# Patient Record
Sex: Male | Born: 1965 | Hispanic: Yes | Smoking: Never smoker
Health system: Southern US, Community
[De-identification: ages and names within clinical notes are randomized; demographics above are authoritative.]

---

## 2017-07-08 ENCOUNTER — Ambulatory Visit: Payer: Worker's Compensation

## 2017-07-08 ENCOUNTER — Encounter: Payer: Self-pay | Admitting: Family Medicine

## 2017-07-08 ENCOUNTER — Ambulatory Visit (INDEPENDENT_AMBULATORY_CARE_PROVIDER_SITE_OTHER): Payer: Worker's Compensation | Admitting: Family Medicine

## 2017-07-08 VITALS — BP 130/80 | HR 83 | Temp 98.1°F | Resp 16 | Ht 64.0 in | Wt 206.4 lb

## 2017-07-08 DIAGNOSIS — M549 Dorsalgia, unspecified: Secondary | ICD-10-CM

## 2017-07-08 DIAGNOSIS — M545 Low back pain, unspecified: Secondary | ICD-10-CM

## 2017-07-08 DIAGNOSIS — M542 Cervicalgia: Secondary | ICD-10-CM

## 2017-07-08 DIAGNOSIS — W19XXXA Unspecified fall, initial encounter: Secondary | ICD-10-CM

## 2017-07-08 MED ORDER — MELOXICAM 7.5 MG PO TABS: 7.5000 mg | ORAL_TABLET | Freq: Every day | ORAL | 0 refills | Status: AC | PRN

## 2017-07-08 MED ORDER — CYCLOBENZAPRINE HCL 5 MG PO TABS: ORAL_TABLET | ORAL | 0 refills | Status: DC

## 2017-07-08 NOTE — Progress Notes (Signed)
Subjective:    Patient ID: Steven Moody, male    DOB: 1965/06/10, 52 y.o.   MRN: [id number]  HPI Steven Moody is a 52 y.o. male Presents today for: Chief Complaint  Patient presents with  . 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 neck pain   Presents with neck and back pain after injury at work 5 days ago (DOI 07/03/17). Was working in [place].  Was pulling a big tree to plant. Tripped and fell on back.  Initially did not think it was going to be an issue, but felt pain about 20 minutes later in neck. Did continue to work including using shovel. Felt some numbness in hands later that night. Still feels something different in hands - feels overall the same. Does not feel any weakness. Back pain is all over, noted night after injury. No bowel or bladder incontinence, no saddle anesthesia, no lower extremity weakness. Was having trouble working since 3 days ago at noon due to pain. No prior back surgery/injury.  No peptic ulcer disease or kidney issues known.    Tx: unknown name, but some pain medication over the counter.    Spanish speaking - video interpreter # [id number]   There are no active problems to display for this patient.  No past medical history on file.  No Known Allergies Prior to Admission medications   Not on File   Social History   Socioeconomic History  . Marital status: Unknown    Spouse name: Not on file  . Number of children: Not on file  . Years of education: Not on file  . Highest education level: Not on file  Social Needs  . Financial resource strain: Not on file  . Food insecurity - worry: Not on file  . Food insecurity - inability: Not on file  . Transportation needs - medical: Not on file  . Transportation needs - non-medical: Not on file  Occupational History  . Not on file  Tobacco Use  . Smoking status: Never Smoker  . Smokeless tobacco: Never Used  Substance and Sexual Activity    . Alcohol use: Yes    Alcohol/week: 4.2 oz    Types: 7 Standard drinks or equivalent per week  . Drug use: No  . Sexual activity: Not on file  Other Topics Concern  . Not on file  Social History Narrative  . Not on file    Review of Systems  Musculoskeletal: Positive for arthralgias, back pain, myalgias and neck pain. Negative for joint swelling and neck stiffness.  Neurological: Positive for numbness.      Objective:   Physical Exam  Constitutional: He appears well-developed and well-nourished.  HENT:  Head: Normocephalic and atraumatic.  Neck: Normal range of motion.  Pulmonary/Chest: Effort normal.  Abdominal: Soft. There is no tenderness.  Musculoskeletal: He exhibits tenderness.       Cervical back: He exhibits tenderness, bony tenderness (Diffusely tender mid cervical spine.), pain and spasm.       Thoracic back: He exhibits tenderness and bony tenderness (Diffusely tender thoracic to lumbar spine including paraspinals no ecchymosis, skin intact).       Lumbar back: He exhibits tenderness and spasm. He exhibits normal range of motion and no bony tenderness.  Neurological: He is alert. He has normal strength. No sensory deficit.  Reflex Scores:      Tricep reflexes are 2+ on  the right side and 2+ on the left side.      Bicep reflexes are 2+ on the right side and 2+ on the left side.      Brachioradialis reflexes are 2+ on the right side and 2+ on the left side.      Patellar reflexes are 2+ on the right side and 2+ on the left side.      Achilles reflexes are 2+ on the right side and 2+ on the left side. Grip strength equal, arm strength equal., No focal weakness lower extremities.  Skin: Skin is warm and dry.  Psychiatric: He has a normal mood and affect. His behavior is normal.  Vitals reviewed.  Vitals:   07/08/17 1216  BP: 130/80  Pulse: 83  Resp: 16  Temp: 98.1 F (36.7 C)  TempSrc: Oral  SpO2: 96%  Weight: 206 lb 6.4 oz (93.6 kg)  Height: 5\' 4"  (1.626 m)     Dg Cervical Spine Complete  Result Date: 07/08/2017 CLINICAL DATA:  Patient fell 5 days ago and has neck pain and numbness in the hands. EXAM: CERVICAL SPINE - COMPLETE 4+ VIEW COMPARISON:  None in PACs. FINDINGS: The cervical vertebral bodies are preserved in height. The disc space heights are reasonably well-maintained. There are anterior endplate osteophytes nearly bridging the C3-4 and C6-7 levels. The prevertebral soft tissue spaces are normal. There is no perched facet. The spinous processes are intact though the C7 spinous process is not well demonstrated. The odontoid is intact. The oblique views reveal no high-grade bony encroachment upon the neural foramina. IMPRESSION: Very mild degenerative disc changes centered at C3-4 and C6-7. No compression fracture nor other acute bony abnormality of the cervical spine. Electronically Signed   By: David  [place] M.D.   On: 07/08/2017 13:13   Dg Thoracic Spine 2 View  Result Date: 07/08/2017 CLINICAL DATA:  The patient reports a fall 5 days ago and has neck pain and numbness in the hands. EXAM: THORACIC SPINE 2 VIEWS COMPARISON:  None in PACs FINDINGS: The thoracic vertebral bodies are preserved in height. The disc space heights are reasonably well-maintained. The pedicles are intact. There is mild degenerative endplate spurring in the mid and lower lumbar spine. There are no abnormal paravertebral soft tissue densities. IMPRESSION: There is mild multilevel degenerative disc disease in the mid and lower thoracic spine. There is no compression fracture. Electronically Signed   By: David  [place] M.D.   On: 07/08/2017 13:17   Dg Lumbar Spine Complete  Result Date: 07/08/2017 CLINICAL DATA:  Patient fell 5 days ago and has neck pain and numbness in the hands. EXAM: LUMBAR SPINE - COMPLETE 4+ VIEW COMPARISON:  None in PACs FINDINGS: The lumbar vertebral bodies are preserved in height. There is disc space narrowing at L3-4, L4-5, and L5-S1. There is no  spondylolisthesis. There is degenerative facet joint change at L5-S1. There are anterior near bridging spurs at L1-2, L2-3, and L3-4. There are lateral bridging osteophytes noted bilaterally at L2-3 and at L4-5 on the left. The pedicles and transverse processes are intact. IMPRESSION: There is multilevel degenerative disc disease. There is no compression fracture or spondylolisthesis. There is mild facet joint hypertrophy at L5-S1. Electronically Signed   By: David  [place] M.D.   On: 07/08/2017 13:16         Assessment & Plan:    Steven Moody is a 52 y.o. male Fall, initial encounter  Neck pain - Plan: DG Cervical Spine Complete, cyclobenzaprine (FLEXERIL)  5 MG tablet, meloxicam (MOBIC) 7.5 MG tablet  Upper back pain - Plan: DG Thoracic Spine 2 View, cyclobenzaprine (FLEXERIL) 5 MG tablet, meloxicam (MOBIC) 7.5 MG tablet  Acute bilateral low back pain without sciatica - Plan: DG Lumbar Spine Complete, cyclobenzaprine (FLEXERIL) 5 MG tablet, meloxicam (MOBIC) 7.5 MG tablet  Fall at work with date of injury February 27. Denied initial pain or debility, but notice soreness in neck 20 minutes later. Some initial dysesthesias of hands, but no weakness. Reassuring exam at present without signs of weakness and intact reflexes. Degenerative changes seen on x-ray without fracture. Suspected cervical strain with secondary spasm.   -Symptomatic care with meloxicam daily, Flexeril up to every 8 hours - with plan to transition to bedtime due to sedation risk. Side effects, fall precautions discussed   -Out of work for next 3 days, then recheck. RTC/ER precautions if worsening sooner   -Note provided for employer   -Spanish speaking interpreter used as above with understanding expressed  Meds ordered this encounter  Medications  . cyclobenzaprine (FLEXERIL) 5 MG tablet    Sig: 1 pill by mouth up to every 8 hours as needed. Start with one pill by mouth each bedtime as needed due to sedation.    Label in spanish    Dispense:  15 tablet    Refill:  0  . meloxicam (MOBIC) 7.5 MG tablet    Sig: Take 1 tablet (7.5 mg total) by mouth daily as needed for pain.    Dispense:  30 tablet    Refill:  0   Patient Instructions   meloxicam cada dia si necesario.cyclobenzaprine cada 8 horas si necesario.  No otro medicina sin receta. No trabaje a esta tiempo, regrese en 3 dias. Mas temprano o cuarto de emergencia si empeorse   Dolor de espalda en adultos Back Pain, Adult Muchos adultos sufren dolor de espalda de vez en cuando. Las causas comunes del dolor de espalda son las siguientes:  Neomia Dear distensin del msculo o el ligamento.  Desgaste (degeneracin) de los discos vertebrales.  Artritis.  Un golpe en la espalda.  El dolor de espalda puede ser breve (agudo) o prolongado (crnico). Es posible que se realicen un examen fsico, anlisis de laboratorio u otros estudios de diagnstico por imgenes para Veterinary surgeon causa del Engineer, mining. Siga estas indicaciones en su casa: Control del dolor y de la rigidez  [employer] medicamentos de venta libre y los recetados solamente como se lo haya indicado el mdico.  Si se lo indican, aplique calor en la zona afectada con la frecuencia que le haya indicado el mdico. Use la fuente de calor que el mdico le recomiende, como una compresa de calor hmedo o una almohadilla trmica. ? Colquese una [employer] piel y la fuente de Airline pilot. ? Aplique el calor durante 20 a . ? Retire la fuente de calor si la piel se le pone de color rojo brillante. Esto es muy importante si no puede sentir el dolor, el calor ni el fro. Corre un mayor riesgo de sufrir quemaduras.  Si se lo indican, aplique hielo sobre la zona lesionada: ? Ponga el hielo en una bolsa plstica. ? Coloque una [employer] piel y la bolsa de hielo. ? Deje el hielo durante , de 2 a 3veces por da, durante los primeros 2 o 3das. Actividad  No permanezca en la cama. Si  hace reposo ms de 1 a 2 das, puede demorar su recuperacin.  Realice caminatas cortas en  superficies planas tan pronto como le sea posible. Trate de caminar un poco ms de Pharmacist, communitytiempo cada da.  No se siente, conduzca o permanezca de pie en un mismo lugar durante ms de 30 minutos seguidos. Pararse o sentarse durante largos perodos de Contractortiempo puede sobrecargar la espalda.  Use tcnicas apropiadas para levantar objetos. Cuando se inclina y Solicitorlevanta un [place]objeto, utilice posiciones que no sobrecarguen tanto la espalda: ? Flexione las rodillas. ? Mantenga la carga cerca del cuerpo. ? No se tuerza.  Haga actividad fsica habitualmente como se lo haya indicado el mdico. Hacer ejercicio ayudar a que su espalda se cure ms rpido. Adems, esto ayuda a prevenir lesiones de espalda al Kimberly-Clarkmantener los msculos fuertes y flexibles.  El mdico puede recomendarle que consulte a un fisioterapeuta. Esta persona puede ayudarlo a elaborar un programa de ejercicios seguro. Haga ejercicios como se lo haya indicado el fisioterapeuta. Estilo de vida  Mantenga un peso saludable. El sobrepeso sobrecarga la espalda y hace que resulte difcil tener una buena [place]postura.  Evite actividades o situaciones que lo hagan sentirse ansioso o estresado. Aprenda maneras de manejar la ansiedad y el estrs. Una forma de controlar el estrs es a travs del ejercicio. El estrs y la ansiedad aumentan la tensin muscular y pueden empeorar el dolor de espalda. Instrucciones generales  Duerma sobre un colchn firme en una posicin cmoda. Intente acostarse de costado, con las rodillas ligeramente flexionadas. Si se recuesta Fisher Scientificsobre la espalda, coloque una almohada debajo de las rodillas.  Siga el plan de tratamiento como se lo haya indicado el mdico. Esto puede incluir lo siguiente: ? Terapia cognitiva o conductual. ? Acupuntura o terapia de masajes. ? Yoga o meditacin. Comunquese con un mdico si:  Siente un dolor que no se alivia con  reposo o medicamentos.  Siente mucho dolor que se extiende a las piernas o las nalgas.  El dolor no mejora en 2 semanas.  Siente dolor por la noche.  Pierde peso.  Tiene fiebre o siente escalofros. Solicite ayuda de inmediato si:  Tiene nuevos problemas para controlar la vejiga o los intestinos.  Siente debilidad o adormecimiento inusuales en los brazos o en las piernas.  Siente nuseas o vmitos.  Siente dolor abdominal.  Siente que va a desmayarse. Resumen  Muchos adultos sufren dolor de espalda de vez en cuando. Es posible que se realicen un examen fsico, anlisis de laboratorio u otros estudios de diagnstico por imgenes para Veterinary surgeonencontrar la causa del Engineer, miningdolor.  Use tcnicas apropiadas para levantar objetos. Cuando se inclina y levanta un [place]objeto, utilice posiciones que no sobrecarguen tanto la espalda.  Tome los medicamentos de venta libre o recetados y aplique calor o hielo solamente como se lo haya indicado el mdico. Esta informacin no tiene Theme park managercomo fin reemplazar el consejo del mdico. Asegrese de hacerle al mdico cualquier pregunta que tenga. Document Released: 04/23/2005 Document Revised: 08/13/2016 Document Reviewed: 08/13/2016 Elsevier Interactive Patient Education  2018 [employer]  Esguince cervical Cervical Sprain Un esguince cervical es un estiramiento o desgarro en uno o ms de los resistentes tejidos tipo cuerdas que conectan los huesos (ligamentos) en la nuca. Un esguince cervical puede ser desde muy leve a muy grave. En los casos graves, el esguince cervical puede hacer que los huesos de la columna (vrtebras) en el cuello se vuelvan inestables. Esto puede causar un dao en la mdula espinal y puede dar Environmental consultantlugar a graves problemas del Sail Harborsistema nervioso. La cantidad de tiempo que demora la mejora de un esguince  cervical depende de la causa y de la extensin de la lesin. La mayora de las veces se curan en 4 a 6 semanas. Cules son las causas? Los esguinces  cervicales pueden producirse por una lesin (traumatismo) como, por ejemplo, en un accidente automovilstico, una cada o un movimiento brusco de adelante hacia atrs de la cabeza y el cuello (latigazo cervical). Los esguinces cervicales leves pueden producirse por el deterioro con el paso del [place], [place], por ejemplo, una postura incorrecta, sentarse en una silla que no proporciona soporte o mirar hacia arriba y [place] abajo durante largos perodos de [place]. Qu incrementa el riesgo? Los siguientes factores pueden hacer que usted sea ms propenso a sufrir esta afeccin:  Arts development officer que conllevan un alto riesgo de sufrir un traumatismo en el cuello. Estos incluyen los deportes de [place], las carreras de [place], la gimnasia y [place].  Asumir riesgos al conducir o [employer] en un vehculo motorizado, como ir a muy altas velocidades.  Tener artrosis de columna.  Tener poca fuerza y flexibilidad en el cuello.  Tener una lesin previa en el cuello.  Tener mala postura.  Estar durante mucho tiempo en ciertas posiciones que estresan el cuello, como sentarse delante de la computadora durante largos perodos de [place].  Cules son los signos o los sntomas? Los sntomas de esta afeccin incluyen lo siguiente:  Dolor, inflamacin, entumecimiento, dolor a la palpacin, hinchazn o una sensacin de ardor en el frente, la parte posterior y los laterales del cuello.  Tensin repentina en los msculos del cuello que no puede controlar (espasmos musculares).  Dolor en los hombros o la parte superior de la espalda.  Capacidad limitada para mover el cuello.  Dolor de [place].  Mareos.  Nuseas.  Vmitos.  Debilidad, adormecimiento u hormigueo en la mano o el brazo.  Los sntomas pueden manifestarse inmediatamente despus de la lesin o en el transcurso de [place]. En algunos casos, los sntomas pueden desaparecer con tratamiento y volver a aparecer ms adelante. Cmo se  diagnostica? Esta afeccin se puede diagnosticar en funcin de lo siguiente:  Sus antecedentes mdicos.  Sus sntomas.  Cualquier lesin reciente o problemas conocidos en el cuello que tenga, como artritis en el cuello.  Un examen fsico.  Pruebas de diagnstico por imgenes, como: ? Radiografas. ? Resonancia magntica (RM). ? Exploracin por tomografa computarizada (TC).  Cmo se trata esta afeccin? Esta afeccin se trata con reposo y la aplicacin de hielo en la zona lesionada, y con ejercicios de fisioterapia. El tratamiento vara en funcin de la gravedad de la afeccin y puede incluir lo siguiente:  La inmovilizacin del cuello durante perodos de [place]. Esto se puede hacer usando lo siguiente: ? Un collarn cervical. Este sostiene el mentn y la parte posterior del cuello. ? Un dispositivo de traccin cervical. Se trata de un cabestrillo que sostiene el cuello. Esto [place] peso y presin del cuello, y puede ayudar a Engineer, materials.  Medicamentos que ayudan a Engineer, materials y la inflamacin.  Medicamentos que [employer] msculos (relajantes musculares).  [place]. Esto es raro.  Siga estas indicaciones en su casa: Si tiene un collarn cervical:  selo como se lo haya indicado el mdico. No se quite el collarn excepto que se lo indique su mdico.  Consulte al mdico antes de hacerle ajustes al collarn.  Si tiene [employer], mantngalo fuera del collarn.  Pregntele al mdico si puede quitarse el collarn para baarse e higienizarse. Si lo autorizan  a quitarse el collarn para baarse o higienizarse: ? Siga las indicaciones del mdico acerca de cmo quitarse el collarn de manera segura. ? Para limpiar el collarn, psele un pao con agua y [place], y squelo bien. ? Si el collarn tiene almohadillas desmontables, qutelas cada 1 o 2das y lvelas a mano con agua y [place]. Djelas que se sequen por completo antes de volver a ponerlas en el  collarn. ? Contrlese la piel debajo del collarn para ver si hay irritacin o llagas. Si las tiene, informe a su mdico. Control del dolor, de la rigidez y de la hinchazn  Si se lo indican, use un dispositivo de traccin cervical como le haya explicado el mdico.  Si se lo indican, aplique calor en la zona afectada antes de hacer los ejercicios de fisioterapia o con la frecuencia que le haya indicado el mdico. Use la fuente de calor que el mdico le recomiende, como una compresa de calor hmedo o una almohadilla trmica. ? Coloque una [employer] piel y la fuente de Airline pilot. ? Aplique el calor durante 20 a . ? Retire la fuente de calor si la piel se le pone de color rojo brillante. Esto es muy importante si no puede sentir el dolor, el calor o el fro. Puede correr un riesgo mayor de sufrir quemaduras.  Si se lo indican, aplique hielo sobre la zona afectada: ? Ponga el hielo en una bolsa plstica. ? Coloque una [employer] piel y la bolsa de hielo. ? Coloque el hielo durante , 2 a 3veces por da. Actividad  No conduzca mientras [place] un collarn cervical. Si no tiene un collarn cervical, pregntele al mdico si es seguro que conduzca durante el proceso de curacin del cuello.  No conduzca ni opere maquinaria pesada mientras toma analgsicos o relajantes musculares recetados, a menos que el mdico lo haya autorizado.  No levante objetos que pesen ms de 10libras (4,5kg) hasta que el mdico le diga que es seguro.  Haga reposo como se lo haya indicado el mdico. Evite las posiciones y actividades que [employer] sntomas. Pregntele al mdico qu actividades son seguras para usted.  Si le indican fisioterapia, haga los ejercicios como se lo haya indicado el mdico o el fisioterapeuta. Instrucciones generales  [employer] de venta libre y los recetados solamente como se lo haya indicado el mdico.  No consuma ningn producto que contenga nicotina o  tabaco, como cigarrillos y Administrator, Civil Service. Estos pueden retrasar la recuperacin. Si necesita ayuda para dejar de fumar, consulte al mdico.  Concurra a todas las visitas de control como se lo haya indicado el mdico. Esto es importante. Cmo se previene? Para evitar que se produzca nuevamente un esguince cervical:  Mantenga una buena postura. Haga los ajustes necesarios en su lugar de trabajo para favorecer una buena postura.  Haga ejercicio regularmente como se lo haya indicado el fisioterapeuta o su mdico.  Evite realizar actividades de riesgo que puedan causar un esguince cervical.  Comunquese con un mdico si:  Los sntomas empeoran o no mejoran despus de 2semanas de [place].  Tiene un dolor que empeora o que no mejora con los medicamentos.  Le aparecen sntomas nuevos e inexplicables.  Tiene llagas o la piel irritada en el cuello por el uso del collarn cervical. Solicite ayuda de inmediato si:  Siente dolor intenso.  Siente adormecimiento, hormigueo o debilidad en cualquier parte del cuerpo.  No puede mover una parte del cuerpo (tiene parlisis).  Siente  dolor en el cuello junto con: ? Mareos intensos. ? Dolor de [place]. Resumen  Un esguince cervical es un estiramiento o desgarro en uno o ms de los resistentes tejidos tipo cuerdas que [employer] (ligamentos) en la nuca.  Los esguinces cervicales pueden producirse por una lesin (traumatismo) como, por ejemplo, en un accidente automovilstico, una cada o un movimiento brusco de adelante hacia atrs de la cabeza y el cuello (latigazo cervical).  Los sntomas pueden manifestarse inmediatamente despus de la lesin o en el transcurso de [place].  Esta afeccin se trata con reposo y la aplicacin de hielo en la zona lesionada, y con ejercicios de fisioterapia. Esta informacin no tiene Theme park manager el consejo del mdico. Asegrese de hacerle al mdico cualquier pregunta que  tenga. Document Released: 07/20/2008 Document Revised: 07/23/2016 Document Reviewed: 10/29/2012 Elsevier Interactive Patient Education  2018 [employer].    IF you received an x-ray today, you will receive an invoice from [facility]. Please contact [facility] at [phone] with questions or concerns regarding your invoice.   IF you received labwork today, you will receive an invoice from [facility]. Please contact [facility] at [phone] with questions or concerns regarding your invoice.   Our billing staff will not be able to assist you with questions regarding bills from these companies.  You will be contacted with the lab results as soon as they are available. The fastest way to get your results is to activate your My Chart account. Instructions are located on the last page of this paperwork. If you have not heard from [place] regarding the results in 2 weeks, please contact this office.       Signed,   Meredith Staggers, MD Primary Care at [facility].  07/10/17 5:13 PM

## 2017-07-08 NOTE — Patient Instructions (Addendum)
meloxicam cada dia si necesario.cyclobenzaprine cada 8 horas si necesario.  No otro medicina sin receta. No trabaje a esta tiempo, regrese en 3 dias. Mas temprano o cuarto de emergencia si empeorse   Dolor de espalda en adultos Back Pain, Adult Muchos adultos sufren dolor de espalda de vez en cuando. Las causas comunes del dolor de espalda son las siguientes:  Neomia Dear distensin del msculo o el ligamento.  Desgaste (degeneracin) de los discos vertebrales.  Artritis.  Un golpe en la espalda.  El dolor de espalda puede ser breve (agudo) o prolongado (crnico). Es posible que se realicen un examen fsico, anlisis de laboratorio u otros estudios de diagnstico por imgenes para Veterinary surgeon causa del Engineer, mining. Siga estas indicaciones en su casa: Control del dolor y de la rigidez  [employer] medicamentos de venta libre y los recetados solamente como se lo haya indicado el mdico.  Si se lo indican, aplique calor en la zona afectada con la frecuencia que le haya indicado el mdico. Use la fuente de calor que el mdico le recomiende, como una compresa de calor hmedo o una almohadilla trmica. ? Colquese una [employer] piel y la fuente de Airline pilot. ? Aplique el calor durante 20 a . ? Retire la fuente de calor si la piel se le pone de color rojo brillante. Esto es muy importante si no puede sentir el dolor, el calor ni el fro. Corre un mayor riesgo de sufrir quemaduras.  Si se lo indican, aplique hielo sobre la zona lesionada: ? Ponga el hielo en una bolsa plstica. ? Coloque una [employer] piel y la bolsa de hielo. ? Deje el hielo durante , de 2 a 3veces por da, durante los primeros 2 o 3das. Actividad  No permanezca en la cama. Si hace reposo ms de 1 a 2 das, puede demorar su recuperacin.  Realice caminatas cortas en superficies planas tan pronto como le sea posible. Trate de caminar un poco ms de Pharmacist, community.  No se siente, conduzca o permanezca de  pie en un mismo lugar durante ms de 30 minutos seguidos. Pararse o sentarse durante largos perodos de Contractor la espalda.  Use tcnicas apropiadas para levantar objetos. Cuando se inclina y Solicitor un [place], utilice posiciones que no sobrecarguen tanto la espalda: ? Flexione las rodillas. ? Mantenga la carga cerca del cuerpo. ? No se tuerza.  Haga actividad fsica habitualmente como se lo haya indicado el mdico. Hacer ejercicio ayudar a que su espalda se cure ms rpido. Adems, esto ayuda a prevenir lesiones de espalda al [employer] fuertes y flexibles.  El mdico puede recomendarle que consulte a un fisioterapeuta. Esta persona puede ayudarlo a elaborar un programa de ejercicios seguro. Haga ejercicios como se lo haya indicado el fisioterapeuta. Estilo de vida  Mantenga un peso saludable. El sobrepeso sobrecarga la espalda y hace que resulte difcil tener una buena [place].  Evite actividades o situaciones que lo hagan sentirse ansioso o estresado. Aprenda maneras de manejar la ansiedad y el estrs. Una forma de controlar el estrs es a travs del ejercicio. El estrs y la ansiedad aumentan la tensin muscular y pueden empeorar el dolor de espalda. Instrucciones generales  Duerma sobre un colchn firme en una posicin cmoda. Intente acostarse de costado, con las rodillas ligeramente flexionadas. Si se recuesta [employer], coloque una almohada debajo de las rodillas.  Siga el plan de tratamiento como se lo haya indicado el mdico. Esto puede incluir  lo siguiente: ? Terapia cognitiva o conductual. ? Acupuntura o terapia de masajes. ? Yoga o meditacin. Comunquese con un mdico si:  Siente un dolor que no se alivia con reposo o medicamentos.  Siente mucho dolor que se extiende a las piernas o las nalgas.  El dolor no mejora en 2 semanas.  Siente dolor por la noche.  Pierde peso.  Tiene fiebre o siente escalofros. Solicite ayuda de inmediato  si:  Tiene nuevos problemas para controlar la vejiga o los intestinos.  Siente debilidad o adormecimiento inusuales en los brazos o en las piernas.  Siente nuseas o vmitos.  Siente dolor abdominal.  Siente que va a desmayarse. Resumen  Muchos adultos sufren dolor de espalda de vez en cuando. Es posible que se realicen un examen fsico, anlisis de laboratorio u otros estudios de diagnstico por imgenes para Veterinary surgeon causa del Engineer, mining.  Use tcnicas apropiadas para levantar objetos. Cuando se inclina y levanta un Meyer, utilice posiciones que no sobrecarguen tanto la espalda.  Tome los medicamentos de venta libre o recetados y aplique calor o hielo solamente como se lo haya indicado el mdico. Esta informacin no tiene Theme park manager el consejo del mdico. Asegrese de hacerle al mdico cualquier pregunta que tenga. Document Released: 04/23/2005 Document Revised: 08/13/2016 Document Reviewed: 08/13/2016 Elsevier Interactive Patient Education  2018 [employer].  Esguince cervical Cervical Sprain Un esguince cervical es un estiramiento o desgarro en uno o ms de los resistentes tejidos tipo cuerdas que conectan los huesos (ligamentos) en la nuca. Un esguince cervical puede ser desde muy leve a muy grave. En los casos graves, el esguince cervical puede hacer que los huesos de la columna (vrtebras) en el cuello se vuelvan inestables. Esto puede causar un dao en la mdula espinal y puede dar Environmental consultant a graves problemas del [place]. La cantidad de tiempo que demora la mejora de un esguince cervical depende de la causa y de la extensin de la lesin. La mayora de las veces se curan en 4 a 6 semanas. Cules son las causas? Los esguinces cervicales pueden producirse por una lesin (traumatismo) como, por ejemplo, en un accidente automovilstico, una cada o un movimiento brusco de adelante hacia atrs de la cabeza y el cuello (latigazo cervical). Los esguinces cervicales  leves pueden producirse por el deterioro con el paso del [place], [place], por ejemplo, una postura incorrecta, sentarse en una silla que no proporciona soporte o mirar hacia arriba y [place] abajo durante largos perodos de [place]. Qu incrementa el riesgo? Los siguientes factores pueden hacer que usted sea ms propenso a sufrir esta afeccin:  Arts development officer que conllevan un alto riesgo de sufrir un traumatismo en el cuello. Estos incluyen los deportes de [place], las carreras de [place], la gimnasia y [place].  Asumir riesgos al conducir o [employer] en un vehculo motorizado, como ir a muy altas velocidades.  Tener artrosis de columna.  Tener poca fuerza y flexibilidad en el cuello.  Tener una lesin previa en el cuello.  Tener mala postura.  Estar durante mucho tiempo en ciertas posiciones que estresan el cuello, como sentarse delante de la computadora durante largos perodos de [place].  Cules son los signos o los sntomas? Los sntomas de esta afeccin incluyen lo siguiente:  Dolor, inflamacin, entumecimiento, dolor a la palpacin, hinchazn o una sensacin de ardor en el frente, la parte posterior y los laterales del cuello.  Tensin repentina en los msculos del cuello que no puede controlar (espasmos musculares).  Dolor  en los hombros o la parte superior de la espalda.  Capacidad limitada para mover el cuello.  Dolor de [place].  Mareos.  Nuseas.  Vmitos.  Debilidad, adormecimiento u hormigueo en la mano o el brazo.  Los sntomas pueden manifestarse inmediatamente despus de la lesin o en el transcurso de [place]. En algunos casos, los sntomas pueden desaparecer con tratamiento y volver a aparecer ms adelante. Cmo se diagnostica? Esta afeccin se puede diagnosticar en funcin de lo siguiente:  Sus antecedentes mdicos.  Sus sntomas.  Cualquier lesin reciente o problemas conocidos en el cuello que tenga, como artritis en el cuello.  Un  examen fsico.  Pruebas de diagnstico por imgenes, como: ? Radiografas. ? Resonancia magntica (RM). ? Exploracin por tomografa computarizada (TC).  Cmo se trata esta afeccin? Esta afeccin se trata con reposo y la aplicacin de hielo en la zona lesionada, y con ejercicios de fisioterapia. El tratamiento vara en funcin de la gravedad de la afeccin y puede incluir lo siguiente:  La inmovilizacin del cuello durante perodos de [place]. Esto se puede hacer usando lo siguiente: ? Un collarn cervical. Este sostiene el mentn y la parte posterior del cuello. ? Un dispositivo de traccin cervical. Se trata de un cabestrillo que sostiene el cuello. Esto [place] peso y presin del cuello, y puede ayudar a Engineer, materials.  Medicamentos que ayudan a Engineer, materials y la inflamacin.  Medicamentos que [employer] msculos (relajantes musculares).  [place]. Esto es raro.  Siga estas indicaciones en su casa: Si tiene un collarn cervical:  selo como se lo haya indicado el mdico. No se quite el collarn excepto que se lo indique su mdico.  Consulte al mdico antes de hacerle ajustes al collarn.  Si tiene [employer], mantngalo fuera del collarn.  Pregntele al mdico si puede quitarse el collarn para baarse e higienizarse. Si lo autorizan a Warehouse manager para baarse o higienizarse: ? Siga las indicaciones del mdico acerca de cmo quitarse el collarn de manera segura. ? Para limpiar el collarn, psele un pao con agua y [place], y squelo bien. ? Si el collarn tiene almohadillas desmontables, qutelas cada 1 o 2das y lvelas a mano con agua y [place]. Djelas que se sequen por completo antes de volver a ponerlas en el collarn. ? Contrlese la piel debajo del collarn para ver si hay irritacin o llagas. Si las tiene, informe a su mdico. Control del dolor, de la rigidez y de la hinchazn  Si se lo indican, use un dispositivo de traccin cervical como  le haya explicado el mdico.  Si se lo indican, aplique calor en la zona afectada antes de hacer los ejercicios de fisioterapia o con la frecuencia que le haya indicado el mdico. Use la fuente de calor que el mdico le recomiende, como una compresa de calor hmedo o una almohadilla trmica. ? Coloque una [employer] piel y la fuente de Airline pilot. ? Aplique el calor durante 20 a . ? Retire la fuente de calor si la piel se le pone de color rojo brillante. Esto es muy importante si no puede sentir el dolor, el calor o el fro. Puede correr un riesgo mayor de sufrir quemaduras.  Si se lo indican, aplique hielo sobre la zona afectada: ? Ponga el hielo en una bolsa plstica. ? Coloque una [employer] piel y la bolsa de hielo. ? Coloque el hielo durante , 2 a 3veces por da. Actividad  No  conduzca mientras [place]a un collarn cervical. Si no tiene un collarn cervical, pregntele al mdico si es seguro que conduzca durante el proceso de curacin del cuello.  No conduzca ni opere maquinaria pesada mientras toma analgsicos o relajantes musculares recetados, a menos que el mdico lo haya autorizado.  No levante objetos que pesen ms de 10libras (4,5kg) hasta que el mdico le diga que es seguro.  Haga reposo como se lo haya indicado el mdico. Evite las posiciones y actividades que Countrywide Financialempeoran los sntomas. Pregntele al mdico qu actividades son seguras para usted.  Si le indican fisioterapia, haga los ejercicios como se lo haya indicado el mdico o el fisioterapeuta. Instrucciones generales  Baxter Internationalome los medicamentos de venta libre y los recetados solamente como se lo haya indicado el mdico.  No consuma ningn producto que contenga nicotina o tabaco, como cigarrillos y Administrator, Civil Servicecigarrillos electrnicos. Estos pueden retrasar la recuperacin. Si necesita ayuda para dejar de fumar, consulte al mdico.  Concurra a todas las visitas de control como se lo haya indicado el mdico. Esto es  importante. Cmo se previene? Para evitar que se produzca nuevamente un esguince cervical:  Mantenga una buena postura. Haga los ajustes necesarios en su lugar de trabajo para favorecer una buena postura.  Haga ejercicio regularmente como se lo haya indicado el fisioterapeuta o su mdico.  Evite realizar actividades de riesgo que puedan causar un esguince cervical.  Comunquese con un mdico si:  Los sntomas empeoran o no mejoran despus de 2semanas de Brandonvilletratamiento.  Tiene un dolor que empeora o que no mejora con los medicamentos.  Le aparecen sntomas nuevos e inexplicables.  Tiene llagas o la piel irritada en el cuello por el uso del collarn cervical. Solicite ayuda de inmediato si:  Siente dolor intenso.  Siente adormecimiento, hormigueo o debilidad en cualquier parte del cuerpo.  No puede mover una parte del cuerpo (tiene parlisis).  Siente dolor en el cuello junto con: ? Mareos intensos. ? Dolor de [place]. Resumen  Un esguince cervical es un estiramiento o desgarro en uno o ms de los resistentes tejidos tipo cuerdas que Owens & Minorconectan los huesos (ligamentos) en la nuca.  Los esguinces cervicales pueden producirse por una lesin (traumatismo) como, por ejemplo, en un accidente automovilstico, una cada o un movimiento brusco de adelante hacia atrs de la cabeza y el cuello (latigazo cervical).  Los sntomas pueden manifestarse inmediatamente despus de la lesin o en el transcurso de [place]gunos das.  Esta afeccin se trata con reposo y la aplicacin de hielo en la zona lesionada, y con ejercicios de fisioterapia. Esta informacin no tiene Theme park managercomo fin reemplazar el consejo del mdico. Asegrese de hacerle al mdico cualquier pregunta que tenga. Document Released: 07/20/2008 Document Revised: 07/23/2016 Document Reviewed: 10/29/2012 Elsevier Interactive Patient Education  2018 [employer]    IF you received an x-ray today, you will receive an [facility] Please contact [facility] at [phone] with questions or concerns regarding your invoice.   IF you received labwork today, you will receive an invoice from [facility]. Please contact [facility] at [phone] with questions or concerns regarding your invoice.   Our billing staff will not be able to assist you with questions regarding bills from these companies.  You will be contacted with the lab results as soon as they are available. The fastest way to get your results is to activate your My Chart account. Instructions are located on the last page of this paperwork. If you have not heard from us regarding  the results in 2 weeks, please contact this office.

## 2017-07-27 ENCOUNTER — Encounter: Payer: Self-pay | Admitting: Emergency Medicine

## 2017-07-27 ENCOUNTER — Ambulatory Visit (INDEPENDENT_AMBULATORY_CARE_PROVIDER_SITE_OTHER): Payer: Worker's Compensation | Admitting: Emergency Medicine

## 2017-07-27 VITALS — BP 134/79 | HR 74 | Temp 98.4°F | Resp 16 | Ht 64.0 in | Wt 201.6 lb

## 2017-07-27 DIAGNOSIS — S161XXA Strain of muscle, fascia and tendon at neck level, initial encounter: Secondary | ICD-10-CM | POA: Insufficient documentation

## 2017-07-27 DIAGNOSIS — S29012A Strain of muscle and tendon of back wall of thorax, initial encounter: Secondary | ICD-10-CM | POA: Insufficient documentation

## 2017-07-27 DIAGNOSIS — S161XXD Strain of muscle, fascia and tendon at neck level, subsequent encounter: Secondary | ICD-10-CM

## 2017-07-27 DIAGNOSIS — M7918 Myalgia, other site: Secondary | ICD-10-CM

## 2017-07-27 DIAGNOSIS — S29012D Strain of muscle and tendon of back wall of thorax, subsequent encounter: Secondary | ICD-10-CM

## 2017-07-27 DIAGNOSIS — S46819D Strain of other muscles, fascia and tendons at shoulder and upper arm level, unspecified arm, subsequent encounter: Secondary | ICD-10-CM

## 2017-07-27 DIAGNOSIS — M62838 Other muscle spasm: Secondary | ICD-10-CM | POA: Insufficient documentation

## 2017-07-27 MED ORDER — DICLOFENAC SODIUM 75 MG PO TBEC: 75.0000 mg | DELAYED_RELEASE_TABLET | Freq: Two times a day (BID) | ORAL | 0 refills | Status: AC

## 2017-07-27 MED ORDER — CYCLOBENZAPRINE HCL 10 MG PO TABS: 10.0000 mg | ORAL_TABLET | Freq: Every day | ORAL | 0 refills | Status: AC

## 2017-07-27 NOTE — Progress Notes (Signed)
Steven Moody 52 y.o.   Chief Complaint  Patient presents with  . Follow-up    from fall at work on 07/03/17, [employer]  . Neck pain  . Back Pain    HISTORY OF PRESENT ILLNESS: This is a 52 y.o. 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  Injured neck and upper back.  Hurting since.  Has tried to go back to work but it makes symptoms worse.  Ran out of medications.  Requesting work note.   HPI   Prior to Admission medications   Medication Sig Start Date End Date Taking? Authorizing Provider  cyclobenzaprine (FLEXERIL) 5 MG tablet 1 pill by mouth up to every 8 hours as needed. Start with one pill by mouth each bedtime as needed due to sedation.  Label in spanish 07/08/17  Yes Shade Flood, MD  meloxicam (MOBIC) 7.5 MG tablet Take 1 tablet (7.5 mg total) by mouth daily as needed for pain. 07/08/17  Yes Shade Flood, MD    No Known Allergies  There are no active problems to display for this patient.   No past medical history on file.  No past surgical history on file.  Social History   Socioeconomic History  . Marital status: Unknown    Spouse name: Not on file  . Number of children: Not on file  . Years of education: Not on file  . Highest education level: Not on file  Occupational History  . Not on file  Social Needs  . Financial resource strain: Not on file  . Food insecurity:    Worry: Not on file    Inability: Not on file  . Transportation needs:    Medical: Not on file    Non-medical: Not on file  Tobacco Use  . Smoking status: Never Smoker  . Smokeless tobacco: Never Used  Substance and Sexual Activity  . Alcohol use: Yes    Alcohol/week: 4.2 oz    Types: 7 Standard drinks or equivalent per week  . Drug use: No  . Sexual activity: Not on file  Lifestyle  . Physical activity:    Days per week: Not on file    Minutes per session: Not on file  . Stress: Not on file  Relationships  . Social  connections:    Talks on phone: Not on file    Gets together: Not on file    Attends religious service: Not on file    Active member of club or organization: Not on file    Attends meetings of clubs or organizations: Not on file    Relationship status: Not on file  . Intimate partner violence:    Fear of current or ex partner: Not on file    Emotionally abused: Not on file    Physically abused: Not on file    Forced sexual activity: Not on file  Other Topics Concern  . Not on file  Social History Narrative  . Not on file    No family history on file.   Review of Systems  Constitutional: Negative.  Negative for fever.  HENT: Negative.  Negative for hearing loss, nosebleeds and sore throat.   Eyes: Negative.  Negative for blurred vision and double vision.  Respiratory: Negative.  Negative for cough and shortness of breath.   Cardiovascular: Negative.  Negative for chest pain and palpitations.  Gastrointestinal: Negative.  Negative for abdominal pain, blood in stool, diarrhea, nausea and vomiting.  Genitourinary: Negative.  Negative for dysuria, frequency, hematuria and urgency.  Musculoskeletal: Positive for back pain and neck pain.  Skin: Negative.  Negative for rash.  Neurological: Negative.  Negative for dizziness, sensory change, speech change, focal weakness, loss of consciousness, weakness and headaches.  Endo/Heme/Allergies: Negative.   All other systems reviewed and are negative.   Vitals:   07/27/17 0938  BP: 134/79  Pulse: 74  Resp: 16  Temp: 98.4 F (36.9 C)  SpO2: 97%    Physical Exam  Constitutional: He is oriented to person, place, and time. He appears well-developed and well-nourished.  HENT:  Head: Normocephalic and atraumatic.  Right Ear: External ear normal.  Left Ear: External ear normal.  Nose: Nose normal.  Mouth/Throat: Oropharynx is clear and moist.  Eyes: Pupils are equal, round, and reactive to light. Conjunctivae and EOM are normal.    Neck: Neck supple. Muscular tenderness present. No spinous process tenderness present. Decreased range of motion present.  Cardiovascular: Normal rate, regular rhythm and normal heart sounds.  Pulmonary/Chest: Effort normal and breath sounds normal. He exhibits no tenderness.  Abdominal: Soft. There is no tenderness.  Musculoskeletal:       Cervical back: He exhibits decreased range of motion, tenderness and spasm. He exhibits no bony tenderness.       Thoracic back: He exhibits decreased range of motion, tenderness and spasm. He exhibits no bony tenderness.       Lumbar back: Normal.       Back:  Neurological: He is alert and oriented to person, place, and time. He displays normal reflexes. No sensory deficit. He exhibits normal muscle tone. Coordination normal.  Skin: Skin is warm and dry. Capillary refill takes less than 2 seconds.  Psychiatric: He has a normal mood and affect. His behavior is normal.  Vitals reviewed.    ASSESSMENT & PLAN: Praise was seen today for follow-up, neck pain and back pain.  Diagnoses and all orders for this visit:  Muscle spasm -     cyclobenzaprine (FLEXERIL) 10 MG tablet; Take 1 tablet (10 mg total) by mouth at bedtime.  Musculoskeletal pain -     diclofenac (VOLTAREN) 75 MG EC tablet; Take 1 tablet (75 mg total) by mouth 2 (two) times daily for 5 days.  Acute cervical myofascial strain, subsequent encounter  Upper back strain, subsequent encounter  Strain of trapezius muscle, unspecified laterality, subsequent encounter   Work note printed for patient.  Should be on light duties.  Will reassess in 2 weeks. Patient Instructions       IF you received an x-ray today, you will receive an invoice from [facility]. Please contact [facility] at [phone] with questions or concerns regarding your invoice.   IF you received labwork today, you will receive an invoice from [facility]. Please contact [facility] at [phone]  with questions or concerns regarding your invoice.   Our billing staff will not be able to assist you with questions regarding bills from these companies.  You will be contacted with the lab results as soon as they are available. The fastest way to get your results is to activate your My Chart account. Instructions are located on the last page of this paperwork. If you have not heard from [place] regarding the results in 2 weeks, please contact this office.     Dolor de espalda en adultos (Back Pain, Adult) El dolor de espalda es muy frecuente. A menudo mejora con el tiempo. La causa del dolor de espalda generalmente  no es peligrosa. La [employer] de las personas puede aprender a Runner, broadcasting/film/video de espalda por s mismas. CUIDADOS EN EL HOGAR Controle su dolor de espalda a fin de Public house manager cambio. Las siguientes indicaciones ayudarn a Psychologist, clinical que pueda sentir:  Materials engineer. Comience con caminatas cortas sobre superficies planas si es posible. Trate de caminar un poco ms cada da.  Haga ejercicios con regularidad tal como le indic el mdico. El ejercicio ayuda a que su espalda se cure ms rpidamente. Tambin ayuda a prevenir futuras lesiones al [employer] fuertes y flexibles.  No se siente, conduzca ni permanezca de pie durante ms de 30 minutos.  No permanezca en la cama. Si hace reposo ms de 1 a 2 das, puede demorar su recuperacin.  Sea cuidadoso al inclinarse o levantar un objeto. Use una tcnica apropiada para levantar peso: ? Flexione las rodillas. ? Mantenga el objeto cerca del cuerpo. ? No gire.  Duerma sobre un [employer]. Recustese sobre un costado y flexione las rodillas. Si se recuesta [employer], coloque una almohada debajo de las rodillas.  Tome los medicamentos solamente como se lo haya indicado el mdico.  Aplique hielo sobre la zona lesionada. ? Ponga el hielo en una bolsa plstica. ? Coloque una [employer] piel y la  bolsa de hielo. ? Deje el hielo durante , 2 a 3veces por da, durante los primeros 2 o 3das. Despus de eso, puede alternar entre compresas de hielo y Airline pilot.  Evite sentir ansiedad o estrs. Encuentre maneras efectivas de lidiar con el estrs, Surveyor, mining ejercicio.  Mantenga un peso saludable. El peso excesivo ejerce tensin sobre la espalda. SOLICITE AYUDA SI:  Siente dolor que no se alivia con reposo o medicamentos.  Siente cada vez ms dolor que se extiende a las piernas o los glteos.  El dolor no mejora en una semana.  Siente dolor por la noche.  Pierde peso.  Siente escalofros o fiebre. SOLICITE AYUDA DE INMEDIATO SI:  No puede controlar su materia fecal (heces) o el pis (orina).  Siente debilidad en las piernas o los brazos.  Siente prdida de la sensibilidad (adormecimiento) en las piernas o los brazos.  Tiene malestar estomacal (nuseas) o vomita.  Siente dolor de estmago (abdominal).  Siente que se desvanece (se desmaya). Esta informacin no tiene Theme park manager el consejo del mdico. Asegrese de hacerle al mdico cualquier pregunta que tenga. Document Released: 11/06/2010 Document Revised: 05/14/2014 Document Reviewed: 08/25/2013 Elsevier Interactive Patient Education  2018 [employer].  Distensin cervical (Cervical Sprain) La distensin cervical se produce cuando los tejidos (ligamentos) del cuello se estiran o se rompen. CUIDADOS EN EL HOGAR  Aplique hielo sobre la zona lesionada. ? Ponga el hielo en una bolsa plstica. ? Colquese una [employer] piel y la bolsa de hielo. ? Deje el hielo durante 15 - 20 minutos y aplquelo 3 - 4 veces por Futures trader.  Es posible que le hayan indicado el uso de un collarn. Este collarn impide que el cuello se mueva mientras se [place]. ? No se lo quite excepto que se lo indique el mdico. ? Si tiene el cabello largo, mantngalo fuera del collarn. ? Consulte a su mdico antes de cambiar la posicin del  collarn. Es posible que necesite cambiar la posicin con el tiempo para sentirse ms cmodo. ? Si le permiten quitarse el collarn para lavarlo o darse un bao, siga las indicaciones de su mdico acerca de cmo hacerlo  con seguridad. ? Mantenga el collarn limpio pasando un pao con agua y [place]. Squelo bien. Si el collarn tiene almohadillas removibles, qutelas cada 1-2 das para lavarlas a mano con agua y [place]. Deje que se sequen al aire. Debe secarlas bien antes de volver a colocarlas en el collarn. ? No conduzca vehculos mientras [place] el collarn.  Slo tome los medicamentos que le haya indicado su mdico.  Cumpla con las visitas al mdico segn las indicaciones.  Cumpla con las sesiones de fisioterapia, segn las indicaciones.  Ajuste su mesa de trabajo de modo que tenga una buena postura al trabajar.  Evite las posiciones y actividades que Countrywide Financialempeoran los sntomas.  Haga un precalentamiento y elongacin adecuados antes de la Altaactividad.  SOLICITE AYUDA SI:  El dolor no se alivia con los United Parcelmedicamentos.  No puede disminuir las dosis de medicamentos para el dolor luego de un tiempo, segn lo planificado.  Su nivel de actividad no mejora segn lo esperado.  SOLICITE AYUDA DE INMEDIATO SI:  Tiene sangrado.  Siente Journalist, newspapermalestar en el estmago.  Tiene alguna reaccin a los medicamentos.  Tiene sntomas nuevos que no puede explicar.  Pierde la sensibilidad (adormecimiento) o no Therapist, nutritionalpuede mover alguna parte del cuerpo (parlisis).  Siente hormigueo o debilidad en alguna parte del cuerpo.  Los sntomas empeoran. Los sntomas son: ? Dolor, sensibilidad, rigidez, inflamacin(hinchazn), o sensacin de ardor en el cuello. ? Siente dolor cuando le tocan el cuello. ? Dolor en el hombro o la zona superior de la espalda. ? Capacidad limitada para mover el cuello. ? Dolor de Turk[place]istancabeza. ? Mareos. ? Debilidad, falta de sensibilidad o sensacin hormigueos en las manos o los brazos. ? Espasmos  musculares. ? Dificultades para tragar o comer.  ASEGRESE DE QUE:  Comprende estas instrucciones.  Controlar su afeccin.  Recibir ayuda de inmediato si no mejora o si empeora.  Esta informacin no tiene Theme park managercomo fin reemplazar el consejo del mdico. Asegrese de hacerle al mdico cualquier pregunta que tenga. Document Released: 04/12/2011 Document Revised: 12/24/2012 Document Reviewed: 10/29/2012 Elsevier Interactive Patient Education  2017 Elsevier Inc.      Edwina BarthMiguel Earlyne Feeser, MD [facility]

## 2017-07-27 NOTE — Patient Instructions (Addendum)
   IF you received an x-ray today, you will receive an invoice from [facility]. Please contact [facility] at [phone] with questions or concerns regarding your invoice.   IF you received labwork today, you will receive an invoice from [facility]. Please contact [facility] at [phone] with questions or concerns regarding your invoice.   Our billing staff will not be able to assist you with questions regarding bills from these companies.  You will be contacted with the lab results as soon as they are available. The fastest way to get your results is to activate your My Chart account. Instructions are located on the last page of this paperwork. If you have not heard from us regarding the results in 2 weeks, please contact this office.     Dolor de espalda en adultos (Back Pain, Adult) El dolor de espalda es muy frecuente. A menudo mejora con el tiempo. La causa del dolor de espalda generalmente no es peligrosa. La mayora de las personas puede aprender a manejar el dolor de espalda por s mismas. CUIDADOS EN EL HOGAR Controle su dolor de espalda a fin de detectar algn cambio. Las siguientes indicaciones ayudarn a aliviar cualquier dolor que pueda sentir:  Mantngase activo. Comience con caminatas cortas sobre superficies planas si es posible. Trate de caminar un poco ms cada da.  Haga ejercicios con regularidad tal como le indic el mdico. El ejercicio ayuda a que su espalda se cure ms rpidamente. Tambin ayuda a prevenir futuras lesiones al mantener los msculos fuertes y flexibles.  No se siente, conduzca ni permanezca de pie durante ms de 30 minutos.  No permanezca en la cama. Si hace reposo ms de 1 a 2 das, puede demorar su recuperacin.  Sea cuidadoso al inclinarse o levantar un objeto. Use una tcnica apropiada para levantar peso: ? Flexione las rodillas. ? Mantenga el objeto cerca del cuerpo. ? No gire.  Duerma sobre un colchn firme. Recustese  sobre un costado y flexione las rodillas. Si se recuesta sobre la espalda, coloque una almohada debajo de las rodillas.  Tome los medicamentos solamente como se lo haya indicado el mdico.  Aplique hielo sobre la zona lesionada. ? Ponga el hielo en una bolsa plstica. ? Coloque una toalla entre la piel y la bolsa de hielo. ? Deje el hielo durante 20minutos, 2 a 3veces por da, durante los primeros 2 o 3das. Despus de eso, puede alternar entre compresas de hielo y calor.  Evite sentir ansiedad o estrs. Encuentre maneras efectivas de lidiar con el estrs, como hacer ejercicio.  Mantenga un peso saludable. El peso excesivo ejerce tensin sobre la espalda. SOLICITE AYUDA SI:  Siente dolor que no se alivia con reposo o medicamentos.  Siente cada vez ms dolor que se extiende a las piernas o los glteos.  El dolor no mejora en una semana.  Siente dolor por la noche.  Pierde peso.  Siente escalofros o fiebre. SOLICITE AYUDA DE INMEDIATO SI:  No puede controlar su materia fecal (heces) o el pis (orina).  Siente debilidad en las piernas o los brazos.  Siente prdida de la sensibilidad (adormecimiento) en las piernas o los brazos.  Tiene malestar estomacal (nuseas) o vomita.  Siente dolor de estmago (abdominal).  Siente que se desvanece (se desmaya). Esta informacin no tiene como fin reemplazar el consejo del mdico. Asegrese de hacerle al mdico cualquier pregunta que tenga. Document Released: 11/06/2010 Document Revised: 05/14/2014 Document Reviewed: 08/25/2013 Elsevier Interactive Patient Education  2018 Elsevier Inc.    Distensin cervical (Cervical Sprain) La distensin cervical se produce cuando los tejidos (ligamentos) del cuello se estiran o se rompen. CUIDADOS EN EL HOGAR  Aplique hielo sobre la zona lesionada. ? Ponga el hielo en una bolsa plstica. ? Colquese una FirstEnergy Corptoalla entre la piel y la bolsa de hielo. ? Deje el hielo durante 15 - 20 minutos y aplquelo  3 - 4 veces por Futures traderda.  Es posible que le hayan indicado el uso de un collarn. Este collarn impide que el cuello se mueva mientras se [place]. ? No se lo quite excepto que se lo indique el mdico. ? Si tiene el cabello largo, mantngalo fuera del collarn. ? Consulte a su mdico antes de cambiar la posicin del collarn. Es posible que necesite cambiar la posicin con el tiempo para sentirse ms cmodo. ? Si le permiten quitarse el collarn para lavarlo o darse un bao, siga las indicaciones de su mdico acerca de cmo hacerlo con seguridad. ? Mantenga el collarn limpio pasando un pao con agua y [place]. Squelo bien. Si el collarn tiene almohadillas removibles, qutelas cada 1-2 das para lavarlas a mano con agua y [place]. Deje que se sequen al aire. Debe secarlas bien antes de volver a colocarlas en el collarn. ? No conduzca vehculos mientras [place] el collarn.  Slo tome los medicamentos que le haya indicado su mdico.  Cumpla con las visitas al mdico segn las indicaciones.  Cumpla con las sesiones de fisioterapia, segn las indicaciones.  Ajuste su mesa de trabajo de modo que tenga una buena postura al trabajar.  Evite las posiciones y actividades que Countrywide Financialempeoran los sntomas.  Haga un precalentamiento y elongacin adecuados antes de la [place]son-Dumontactividad.  SOLICITE AYUDA SI:  El dolor no se alivia con los United Parcelmedicamentos.  No puede disminuir las dosis de medicamentos para el dolor luego de un tiempo, segn lo planificado.  Su nivel de actividad no mejora segn lo esperado.  SOLICITE AYUDA DE INMEDIATO SI:  Tiene sangrado.  Siente Journalist, newspapermalestar en el estmago.  Tiene alguna reaccin a los medicamentos.  Tiene sntomas nuevos que no puede explicar.  Pierde la sensibilidad (adormecimiento) o no Therapist, nutritionalpuede mover alguna parte del cuerpo (parlisis).  Siente hormigueo o debilidad en alguna parte del cuerpo.  Los sntomas empeoran. Los sntomas son: ? Dolor, sensibilidad, rigidez,  inflamacin(hinchazn), o sensacin de ardor en el cuello. ? Siente dolor cuando le tocan el cuello. ? Dolor en el hombro o la zona superior de la espalda. ? Capacidad limitada para mover el cuello. ? Dolor de Turkmenistancabeza. ? Mareos. ? Debilidad, falta de sensibilidad o sensacin hormigueos en las manos o los brazos. ? Espasmos musculares. ? Dificultades para tragar o comer.  ASEGRESE DE QUE:  Comprende estas instrucciones.  Controlar su afeccin.  Recibir ayuda de inmediato si no mejora o si empeora.  Esta informacin no tiene Theme park managercomo fin reemplazar el consejo del mdico. Asegrese de hacerle al mdico cualquier pregunta que tenga. Document Released: 04/12/2011 Document Revised: 12/24/2012 Document Reviewed: 10/29/2012 Elsevier Interactive Patient Education  2017 [employer]

## 2017-08-09 ENCOUNTER — Ambulatory Visit: Payer: Worker's Compensation | Admitting: Emergency Medicine

## 2017-08-12 ENCOUNTER — Other Ambulatory Visit: Payer: Self-pay

## 2017-08-12 ENCOUNTER — Encounter: Payer: Self-pay | Admitting: Emergency Medicine

## 2017-08-12 ENCOUNTER — Ambulatory Visit (INDEPENDENT_AMBULATORY_CARE_PROVIDER_SITE_OTHER): Payer: Worker's Compensation | Admitting: Emergency Medicine

## 2017-08-12 VITALS — BP 130/84 | HR 86 | Temp 99.3°F | Resp 16 | Ht 64.0 in | Wt 207.6 lb

## 2017-08-12 DIAGNOSIS — M7918 Myalgia, other site: Secondary | ICD-10-CM

## 2017-08-12 DIAGNOSIS — S29012D Strain of muscle and tendon of back wall of thorax, subsequent encounter: Secondary | ICD-10-CM

## 2017-08-12 DIAGNOSIS — M62838 Other muscle spasm: Secondary | ICD-10-CM

## 2017-08-12 MED ORDER — PREDNISONE 20 MG PO TABS: 40.0000 mg | ORAL_TABLET | Freq: Every day | ORAL | 0 refills | Status: AC

## 2017-08-12 NOTE — Progress Notes (Signed)
Steven Moody 52 y.o.   Chief Complaint  Patient presents with  . Back Pain    f/u from 07/27/17 work injury  . Neck pain    HISTORY OF PRESENT ILLNESS: This is a 52 y.o. male here for follow-up for Worker's Comp. injury.  Seen by me on 07/27/2017 complaining of back and neck pain.  Neck pain is gone.  Continues to have back pain.  Continues to do the same work despite recommendations to be on light duty.  No new symptoms.  HPI   Prior to Admission medications   Medication Sig Start Date End Date Taking? Authorizing Provider  cyclobenzaprine (FLEXERIL) 10 MG tablet Take 1 tablet (10 mg total) by mouth at bedtime. 07/27/17  Yes Ebbie Sorenson, Eilleen Kempf, MD  meloxicam (MOBIC) 7.5 MG tablet Take 1 tablet (7.5 mg total) by mouth daily as needed for pain. 07/08/17  Yes Shade Flood, MD    No Known Allergies  Patient Active Problem List   Diagnosis Date Noted  . Muscle spasm 07/27/2017  . Musculoskeletal pain 07/27/2017  . Acute cervical myofascial strain 07/27/2017  . Trapezius strain 07/27/2017    No past medical history on file.  No past surgical history on file.  Social History   Socioeconomic History  . Marital status: Unknown    Spouse name: Not on file  . Number of children: Not on file  . Years of education: Not on file  . Highest education level: Not on file  Occupational History  . Not on file  Social Needs  . Financial resource strain: Not on file  . Food insecurity:    Worry: Not on file    Inability: Not on file  . Transportation needs:    Medical: Not on file    Non-medical: Not on file  Tobacco Use  . Smoking status: Never Smoker  . Smokeless tobacco: Never Used  Substance and Sexual Activity  . Alcohol use: Yes    Alcohol/week: 4.2 oz    Types: 7 Standard drinks or equivalent per week  . Drug use: No  . Sexual activity: Not on file  Lifestyle  . Physical activity:    Days per week: Not on file    Minutes per session: Not on file  .  Stress: Not on file  Relationships  . Social connections:    Talks on phone: Not on file    Gets together: Not on file    Attends religious service: Not on file    Active member of club or organization: Not on file    Attends meetings of clubs or organizations: Not on file    Relationship status: Not on file  . Intimate partner violence:    Fear of current or ex partner: Not on file    Emotionally abused: Not on file    Physically abused: Not on file    Forced sexual activity: Not on file  Other Topics Concern  . Not on file  Social History Narrative  . Not on file    No family history on file.   Review of Systems  Constitutional: Negative.  Negative for chills and fever.  HENT: Negative.   Eyes: Negative.  Negative for blurred vision and double vision.  Respiratory: Negative.  Negative for cough and shortness of breath.   Cardiovascular: Negative.  Negative for chest pain and palpitations.  Gastrointestinal: Negative.  Negative for abdominal pain, diarrhea, nausea and vomiting.  Genitourinary: Negative.  Negative for hematuria.  Musculoskeletal:  Positive for back pain. Negative for neck pain.  Skin: Negative.  Negative for rash.  Neurological: Negative for dizziness, tingling, sensory change, focal weakness, weakness and headaches.  Endo/Heme/Allergies: Negative.   All other systems reviewed and are negative.   Vitals:   08/12/17 1713  BP: 130/84  Pulse: 86  Resp: 16  Temp: 99.3 F (37.4 C)  SpO2: 96%    Physical Exam  Constitutional: He is oriented to person, place, and time. He appears well-developed and well-nourished.  HENT:  Head: Normocephalic and atraumatic.  Eyes: Pupils are equal, round, and reactive to light. EOM are normal.  Neck: Normal range of motion. Neck supple.  Cardiovascular: Normal rate and regular rhythm.  Pulmonary/Chest: Effort normal and breath sounds normal.  Abdominal: Soft. He exhibits no distension. There is no tenderness.    Musculoskeletal:       Thoracic back: He exhibits decreased range of motion, pain and spasm. He exhibits no bony tenderness.       Lumbar back: He exhibits decreased range of motion, tenderness, pain and spasm. He exhibits no bony tenderness.  Neurological: He is alert and oriented to person, place, and time. No sensory deficit. He exhibits normal muscle tone. Coordination normal.  Skin: Skin is warm and dry. Capillary refill takes less than 2 seconds.  Psychiatric: He has a normal mood and affect. His behavior is normal.  Vitals reviewed.  A total of 25 minutes was spent in the room with the patient, greater than 50% of which was in counseling/coordination of care.   ASSESSMENT & PLAN: Jahlon was seen today for back pain and neck pain.  Diagnoses and all orders for this visit:  Musculoskeletal pain -     predniSONE (DELTASONE) 20 MG tablet; Take 2 tablets (40 mg total) by mouth daily with breakfast for 5 days.  Muscle spasm  Upper back strain, subsequent encounter    Patient Instructions       IF you received an x-ray today, you will receive an invoice [facility]. Please contact [facility] at [phone] with questions or concerns regarding your invoice.   IF you received labwork today, you will receive an invoice from [facility]. Please contact [facility] at [phone] with questions or concerns regarding your invoice.   Our billing staff will not be able to assist you with questions regarding bills from these companies.  You will be contacted with the lab results as soon as they are available. The fastest way to get your results is to activate your My Chart account. Instructions are located on the last page of this paperwork. If you have not heard from us regarding the results in 2 weeks, please contact this office.     Dolor de espalda en adultos (Back Pain, Adult) El dolor de espalda es muy frecuente. A menudo mejora con el tiempo. La causa  del dolor de espalda generalmente no es peligrosa. La Harley-Davidsonmayora de las personas puede aprender a Runner, broadcasting/film/videomanejar el dolor de espalda por s mismas. CUIDADOS EN EL HOGAR Controle su dolor de espalda a fin de Public house managerdetectar algn cambio. Las siguientes indicaciones ayudarn a Psychologist, clinicalaliviar cualquier dolor que pueda sentir:  Materials engineerMantngase activo. Comience con caminatas cortas sobre superficies planas si es posible. Trate de caminar un poco ms cada da.  Haga ejercicios con regularidad tal como le indic el mdico. El ejercicio ayuda a que su espalda se cure ms rpidamente. Tambin ayuda a prevenir futuras lesiones al Kimberly-Clarkmantener los msculos fuertes y flexibles.  No se siente,  conduzca ni permanezca de pie durante ms de 30 minutos.  No permanezca en la cama. Si hace reposo ms de 1 a 2 das, puede demorar su recuperacin.  Sea cuidadoso al inclinarse o levantar un objeto. Use una tcnica apropiada para levantar peso: ? Flexione las rodillas. ? Mantenga el objeto cerca del cuerpo. ? No gire.  Duerma sobre un [employer]. Recustese sobre un costado y flexione las rodillas. Si se recuesta [employer], coloque una almohada debajo de las rodillas.  Tome los medicamentos solamente como se lo haya indicado el mdico.  Aplique hielo sobre la zona lesionada. ? Ponga el hielo en una bolsa plstica. ? Coloque una [employer] piel y la bolsa de hielo. ? Deje el hielo durante , 2 a 3veces por da, durante los primeros 2 o 3das. Despus de eso, puede alternar entre compresas de hielo y Airline pilot.  Evite sentir ansiedad o estrs. Encuentre maneras efectivas de lidiar con el estrs, Surveyor, mining ejercicio.  Mantenga un peso saludable. El peso excesivo ejerce tensin sobre la espalda. SOLICITE AYUDA SI:  Siente dolor que no se alivia con reposo o medicamentos.  Siente cada vez ms dolor que se extiende a las piernas o los glteos.  El dolor no mejora en una semana.  Siente dolor por la noche.  Pierde  peso.  Siente escalofros o fiebre. SOLICITE AYUDA DE INMEDIATO SI:  No puede controlar su materia fecal (heces) o el pis (orina).  Siente debilidad en las piernas o los brazos.  Siente prdida de la sensibilidad (adormecimiento) en las piernas o los brazos.  Tiene malestar estomacal (nuseas) o vomita.  Siente dolor de estmago (abdominal).  Siente que se desvanece (se desmaya). Esta informacin no tiene Theme park manager el consejo del mdico. Asegrese de hacerle al mdico cualquier pregunta que tenga. Document Released: 11/06/2010 Document Revised: 05/14/2014 Document Reviewed: 08/25/2013 Elsevier Interactive Patient Education  2018 Elsevier Inc.      Edwina Barth, MD [facility]

## 2017-08-12 NOTE — Patient Instructions (Addendum)
IF you received an x-ray today, you will receive an invoice from [facility]. Please contact [facility] at [phone] with questions or concerns regarding your invoice.   IF you received labwork today, you will receive an invoice from [facility]. Please contact [facility] at [phone] with questions or concerns regarding your invoice.   Our billing staff will not be able to assist you with questions regarding bills from these companies.  You will be contacted with the lab results as soon as they are available. The fastest way to get your results is to activate your My Chart account. Instructions are located on the last page of this paperwork. If you have not heard from us regarding the results in 2 weeks, please contact this office.     Dolor de espalda en adultos (Back Pain, Adult) El dolor de espalda es muy frecuente. A menudo mejora con el tiempo. La causa del dolor de espalda generalmente no es peligrosa. La mayora de las personas puede aprender a manejar el dolor de espalda por s mismas. CUIDADOS EN EL HOGAR Controle su dolor de espalda a fin de detectar algn cambio. Las siguientes indicaciones ayudarn a aliviar cualquier dolor que pueda sentir:  Mantngase activo. Comience con caminatas cortas sobre superficies planas si es posible. Trate de caminar un poco ms cada da.  Haga ejercicios con regularidad tal como le indic el mdico. El ejercicio ayuda a que su espalda se cure ms rpidamente. Tambin ayuda a prevenir futuras lesiones al mantener los msculos fuertes y flexibles.  No se siente, conduzca ni permanezca de pie durante ms de 30 minutos.  No permanezca en la cama. Si hace reposo ms de 1 a 2 das, puede demorar su recuperacin.  Sea cuidadoso al inclinarse o levantar un objeto. Use una tcnica apropiada para levantar peso: ? Flexione las rodillas. ? Mantenga el objeto cerca del cuerpo. ? No gire.  Duerma sobre un colchn firme. Recustese  sobre un costado y flexione las rodillas. Si se recuesta sobre la espalda, coloque una almohada debajo de las rodillas.  Tome los medicamentos solamente como se lo haya indicado el mdico.  Aplique hielo sobre la zona lesionada. ? Ponga el hielo en una bolsa plstica. ? Coloque una toalla entre la piel y la bolsa de hielo. ? Deje el hielo durante 20minutos, 2 a 3veces por da, durante los primeros 2 o 3das. Despus de eso, puede alternar entre compresas de hielo y calor.  Evite sentir ansiedad o estrs. Encuentre maneras efectivas de lidiar con el estrs, como hacer ejercicio.  Mantenga un peso saludable. El peso excesivo ejerce tensin sobre la espalda. SOLICITE AYUDA SI:  Siente dolor que no se alivia con reposo o medicamentos.  Siente cada vez ms dolor que se extiende a las piernas o los glteos.  El dolor no mejora en una semana.  Siente dolor por la noche.  Pierde peso.  Siente escalofros o fiebre. SOLICITE AYUDA DE INMEDIATO SI:  No puede controlar su materia fecal (heces) o el pis (orina).  Siente debilidad en las piernas o los brazos.  Siente prdida de la sensibilidad (adormecimiento) en las piernas o los brazos.  Tiene malestar estomacal (nuseas) o vomita.  Siente dolor de estmago (abdominal).  Siente que se desvanece (se desmaya). Esta informacin no tiene como fin reemplazar el consejo del mdico. Asegrese de hacerle al mdico cualquier pregunta que tenga. Document Released: 11/06/2010 Document Revised: 05/14/2014 Document Reviewed: 08/25/2013 Elsevier Interactive Patient Education  2018 Elsevier Inc.  

## 2017-12-05 DEATH — deceased

## 2019-05-23 IMAGING — DX DG CERVICAL SPINE COMPLETE 4+V
6 series · 6 of 6 positions shown · non-contrast
Comparison: None in PACs.

CLINICAL DATA: Patient fell 5 days ago and has neck pain and
numbness in the hands.

EXAM:
CERVICAL SPINE - COMPLETE 4+ VIEW

[c-spine lat]
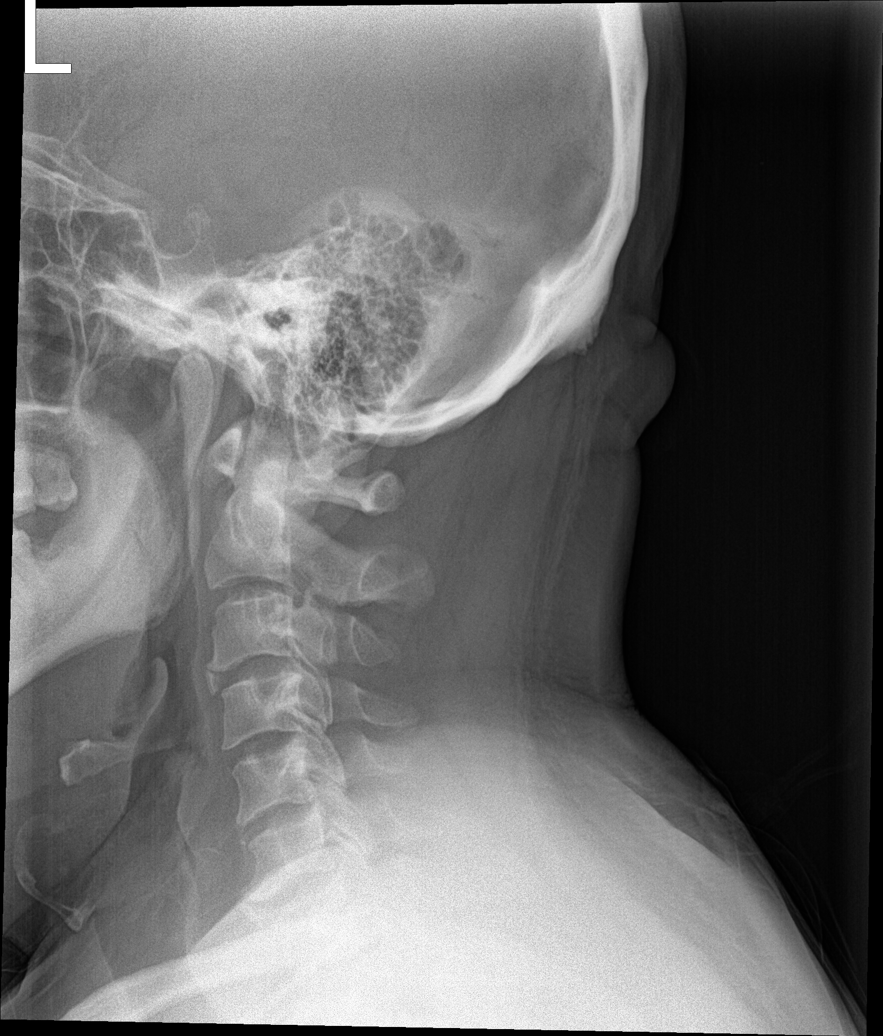

[c-spine obl (1 of 2)]
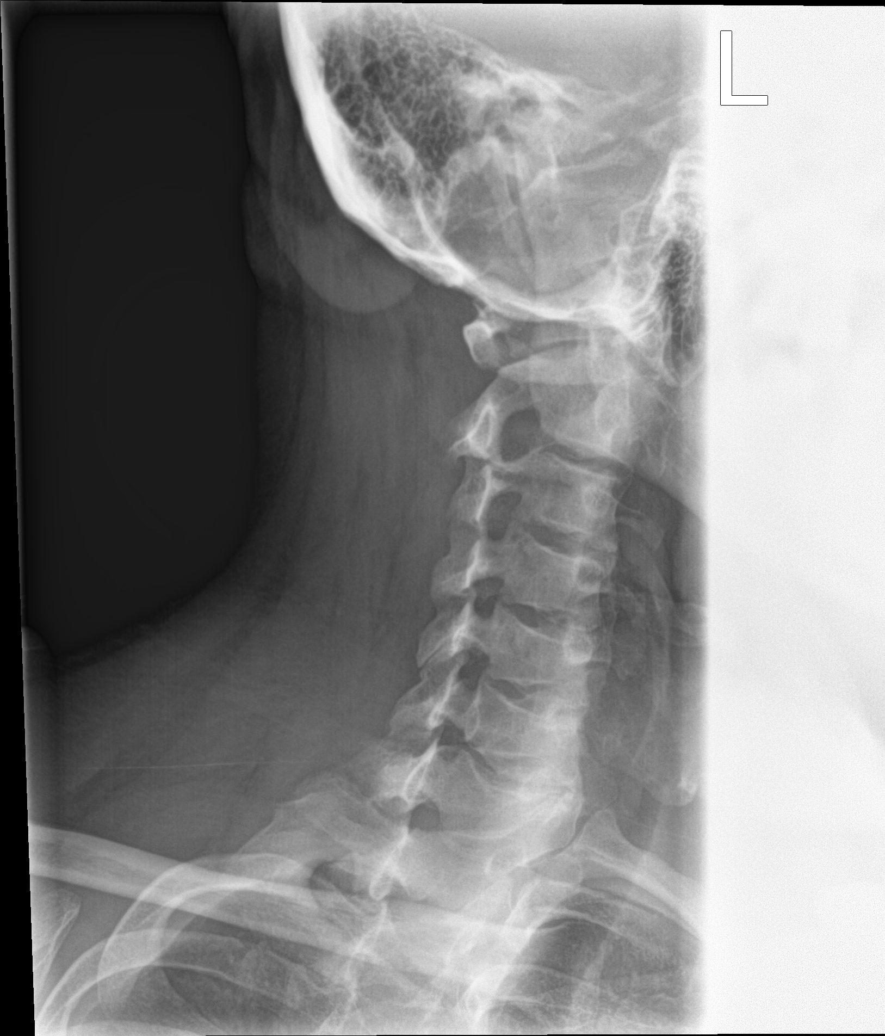

[c-spine obl (2 of 2)]
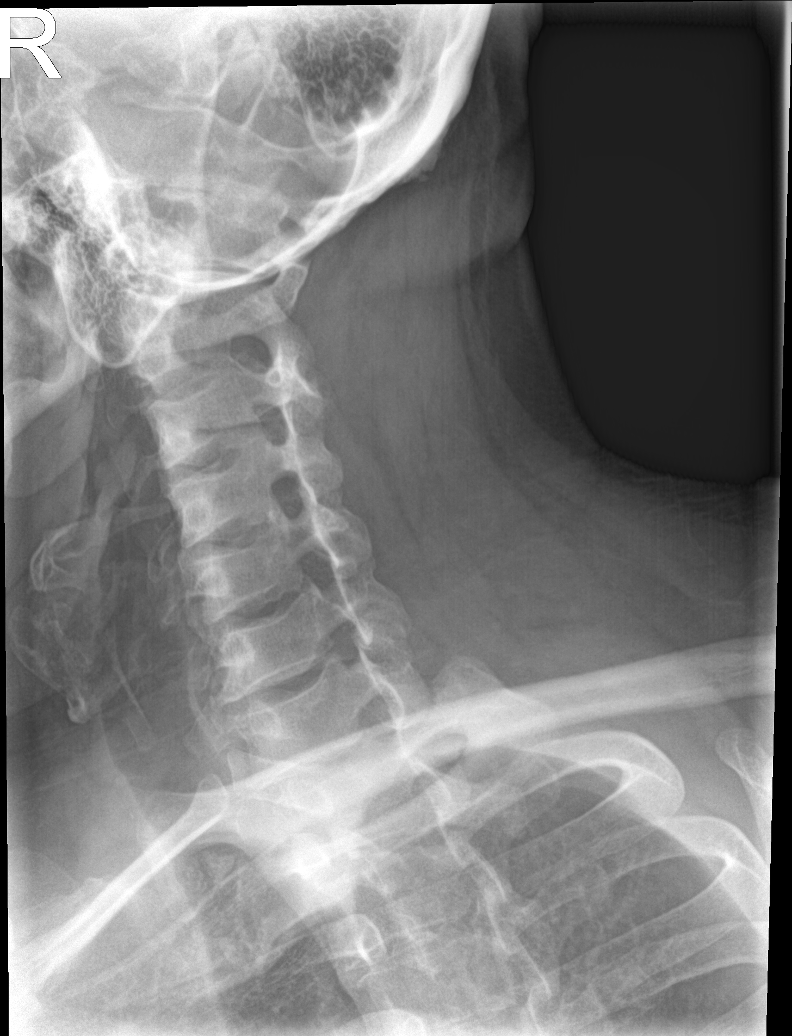

[c-spine ap]
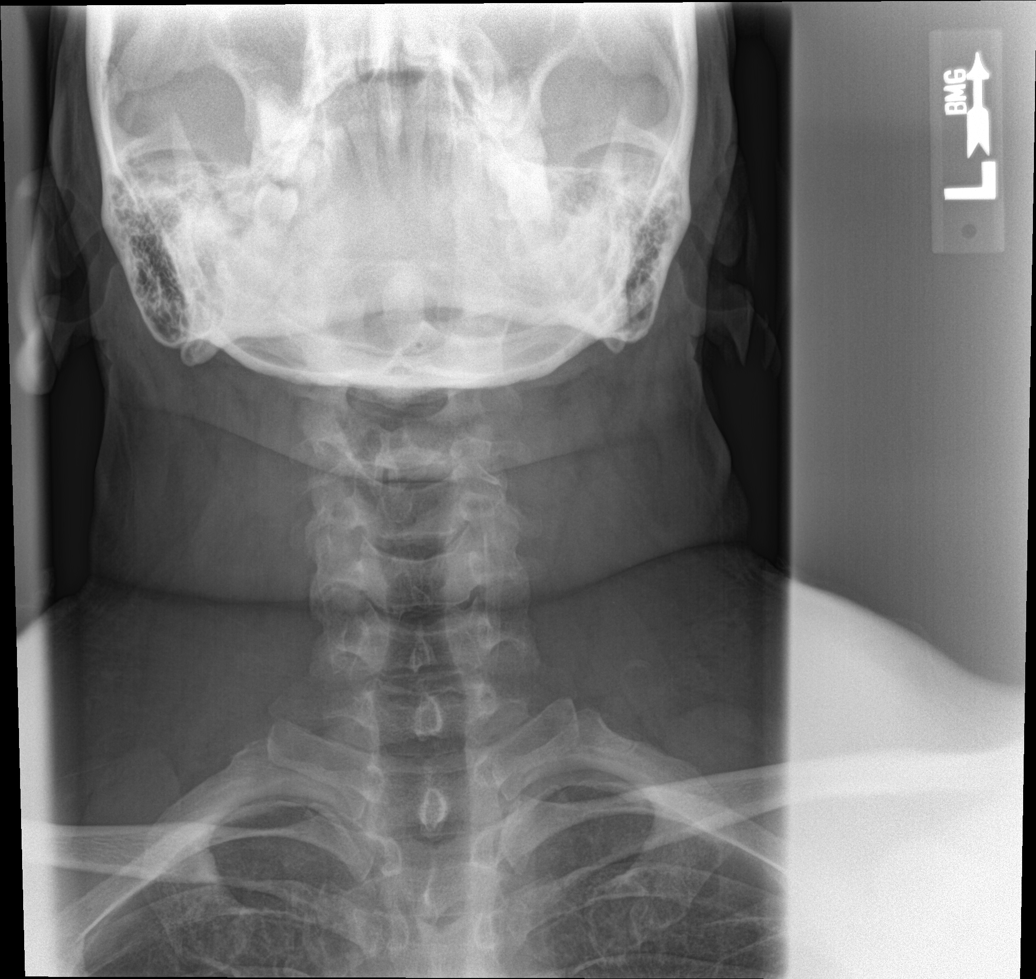

[c-spine open mouth]
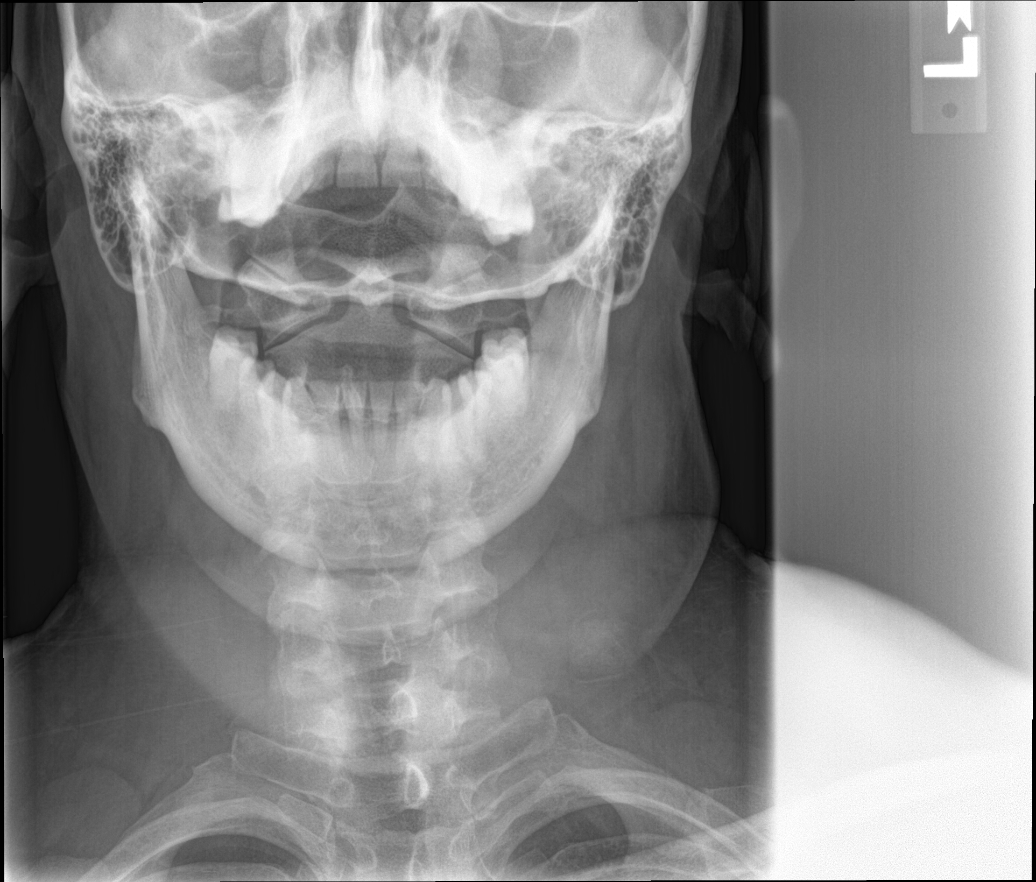

[c-spine swimmers]
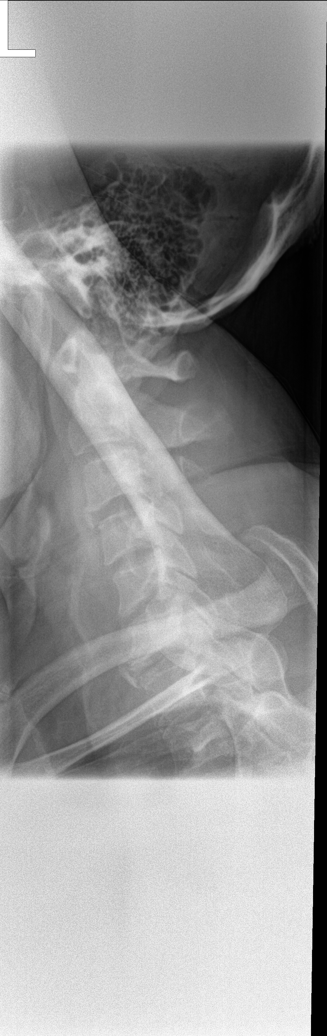

[6 of 6 positions shown; findings below may reference images not displayed]

FINDINGS: The cervical vertebral bodies are preserved in height. The disc
space heights are reasonably well-maintained. There are anterior
endplate osteophytes nearly bridging the C3-4 and C6-7 levels. The
prevertebral soft tissue spaces are normal. There is no perched
facet. The spinous processes are intact though the C7 spinous
process is not well demonstrated. The odontoid is intact. The
oblique views reveal no high-grade bony encroachment upon the neural
foramina.
IMPRESSION: Very mild degenerative disc changes centered at C3-4 and C6-7. No
compression fracture nor other acute bony abnormality of the
cervical spine.

## 2019-05-23 IMAGING — DX DG THORACIC SPINE 2V
2 series · 2 of 2 positions shown · non-contrast
Comparison: None in PACs

CLINICAL DATA: The patient reports a fall 5 days ago and has neck
pain and numbness in the hands.

EXAM:
THORACIC SPINE 2 VIEWS

[t-spine ap]
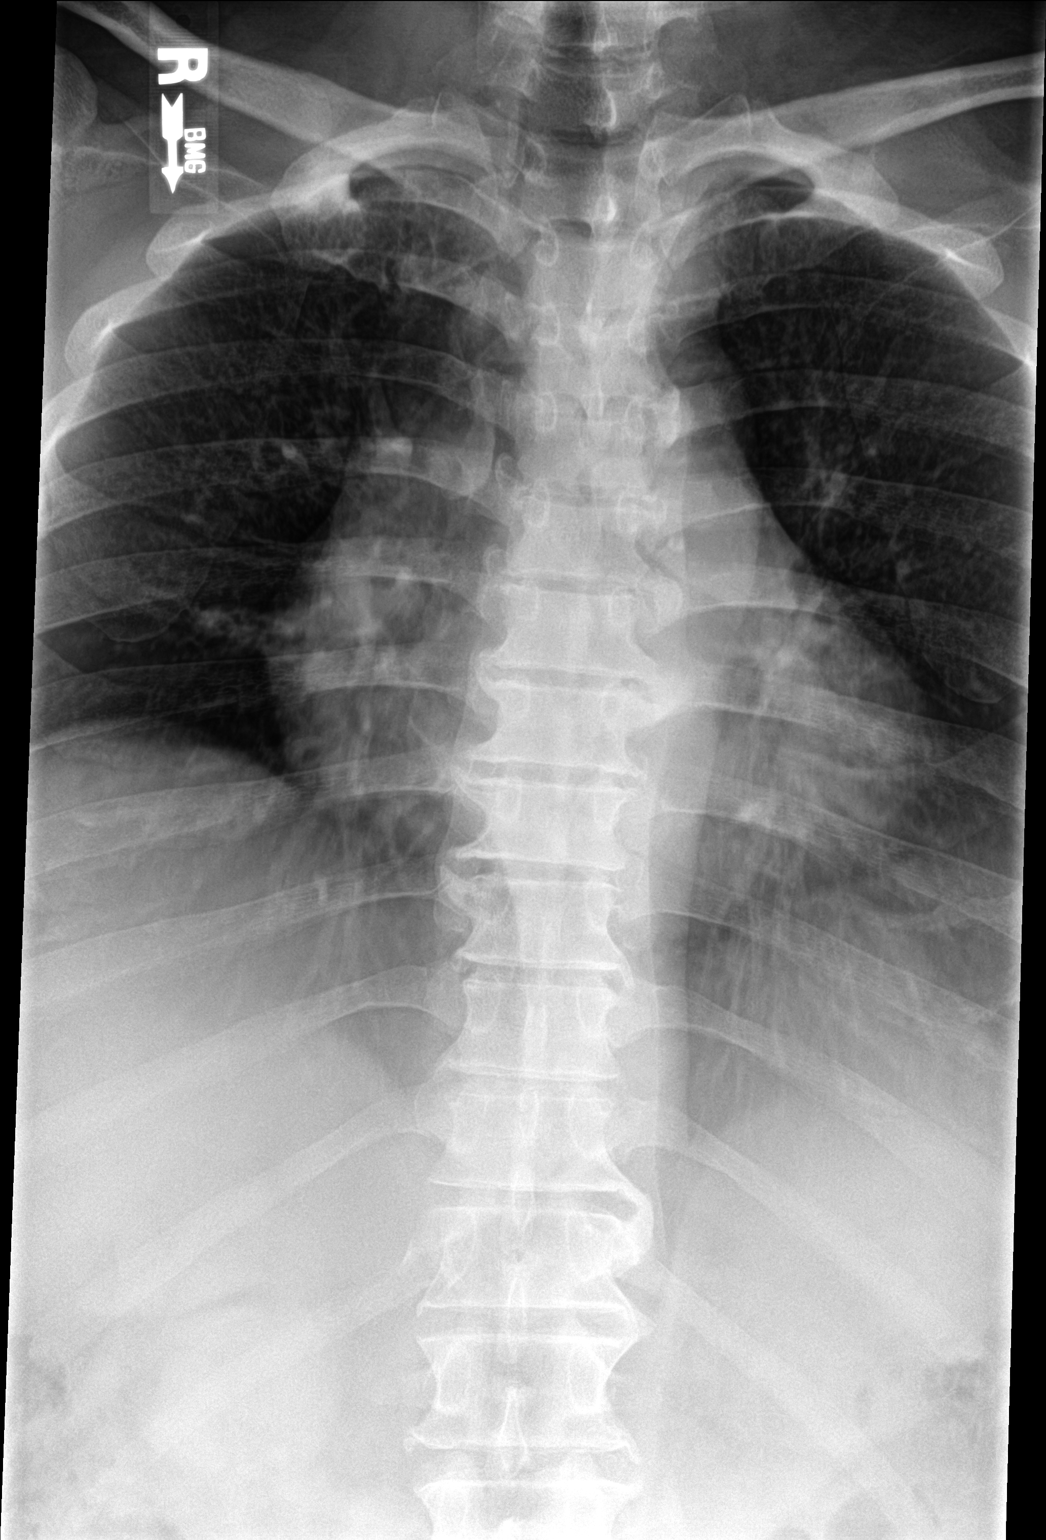

[t-spine lat]
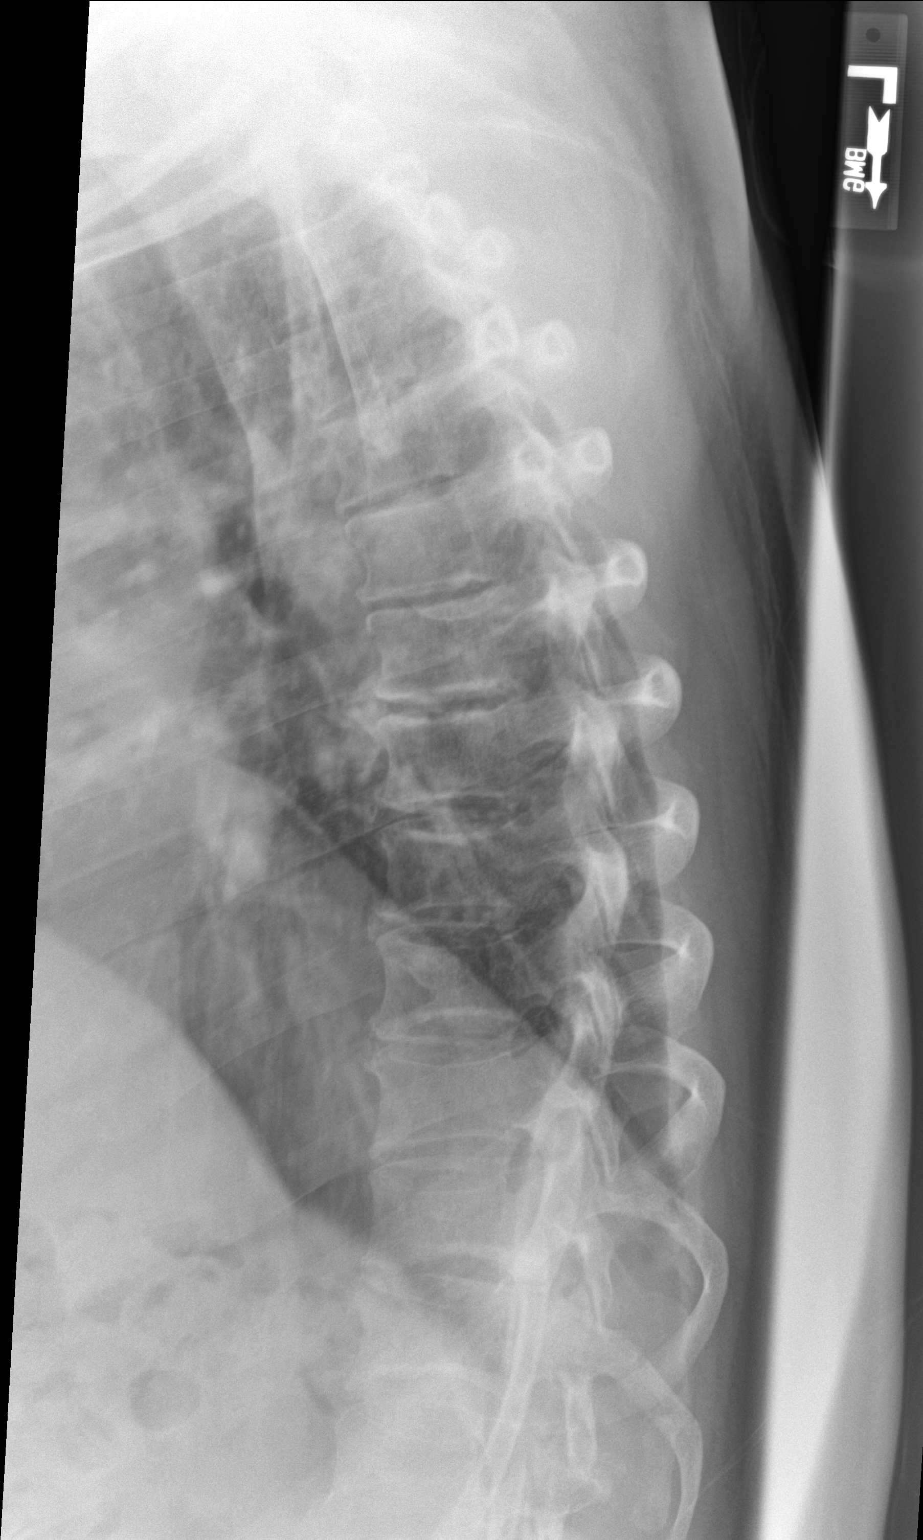

[2 of 2 positions shown; findings below may reference images not displayed]

FINDINGS: The thoracic vertebral bodies are preserved in height. The disc
space heights are reasonably well-maintained. The pedicles are
intact. There is mild degenerative endplate spurring in the mid and
lower lumbar spine. There are no abnormal paravertebral soft tissue
densities.
IMPRESSION: There is mild multilevel degenerative disc disease in the mid and
lower thoracic spine. There is no compression fracture.
